# Patient Record
Sex: Male | Born: 1970 | Hispanic: Yes | Marital: Married | Smoking: Never smoker
Health system: Southern US, Community
[De-identification: ages and names within clinical notes are randomized; demographics above are authoritative.]

---

## 2017-08-04 ENCOUNTER — Emergency Department (HOSPITAL_BASED_OUTPATIENT_CLINIC_OR_DEPARTMENT_OTHER)
Admission: EM | Admit: 2017-08-04 | Discharge: 2017-08-05 | Disposition: A | Discharge status: Home / Self Care | Payer: Managed Care, Other (non HMO) | Attending: Emergency Medicine | Admitting: Emergency Medicine

## 2017-08-04 ENCOUNTER — Encounter (HOSPITAL_BASED_OUTPATIENT_CLINIC_OR_DEPARTMENT_OTHER): Payer: Self-pay | Admitting: *Deleted

## 2017-08-04 DIAGNOSIS — R42 Dizziness and giddiness: Secondary | ICD-10-CM

## 2017-08-04 MED ORDER — ONDANSETRON HCL 4 MG/2ML IJ SOLN
INTRAMUSCULAR | Status: AC
  Administered 2017-08-04: 4 mg
  Filled 2017-08-04: qty 2

## 2017-08-04 MED ORDER — PROMETHAZINE HCL 25 MG/ML IJ SOLN
25.0000 mg | Freq: Once | INTRAMUSCULAR | Status: AC
  Administered 2017-08-04: 25 mg via INTRAMUSCULAR
  Filled 2017-08-04 (×2): qty 1

## 2017-08-04 MED ORDER — MECLIZINE HCL 25 MG PO TABS
50.0000 mg | ORAL_TABLET | Freq: Once | ORAL | Status: AC
  Administered 2017-08-04: 50 mg via ORAL
  Filled 2017-08-04: qty 2

## 2017-08-04 NOTE — Discharge Instructions (Addendum)
You are ordered an MRI Brain to receive through the [facility]  Please take this paperwork to ED triage.

## 2017-08-04 NOTE — ED Provider Notes (Signed)
[facility] EMERGENCY DEPARTMENT Provider Note   CSN: [id number] Arrival date & time: 08/04/17  1191     History   Chief Complaint Chief Complaint  Patient presents with  . Dizziness    HPI Calvin Trevino is a 46 y.o. male.  HPI  46 year old male who presents on onset of dizziness this evening while he was walking out of his hotel room. Symptoms occurred at around 6 PM. He states that it feels that the room in front of him is moving. Associated with nausea and vomiting. Has not had similar symptoms before. Symptoms better when he is lying down and closing his eyes. Worse when he is getting up and trying to walk. No recent URI symptoms or fever. No double vision, slurred speech or word finding issues, focal numbness or weakness, chest pain or difficulty breathing.  History reviewed. No pertinent past medical history.  There are no active problems to display for this patient.   History reviewed. No pertinent surgical history.    Home Medications    Prior to Admission medications   Not on File    Family History No family history on file.  Social History Social History  Substance Use Topics  . Smoking status: Never Smoker  . Smokeless tobacco: Never Used  . Alcohol use No     Allergies   Patient has no known allergies.   Review of Systems Review of Systems  Constitutional: Negative for fever.  Respiratory: Negative for shortness of breath.   Cardiovascular: Negative for chest pain.  Gastrointestinal: Positive for nausea and vomiting. Negative for abdominal pain.  Neurological: Negative for headaches.  All other systems reviewed and are negative.    Physical Exam Updated Vital Signs BP 131/71   Pulse 83   Temp 98.1 F (36.7 C) (Oral)   Resp 16   SpO2 97%   Physical Exam Physical Exam  Nursing note and vitals reviewed. Constitutional: Well developed, well nourished, non-toxic, and in no acute distress Head: Normocephalic and atraumatic.    Mouth/Throat: Oropharynx is clear and moist.  Neck: Normal range of motion. Neck supple.  Cardiovascular: Normal rate and regular rhythm.   Pulmonary/Chest: Effort normal and breath sounds normal.  Abdominal: Soft. There is no tenderness. There is no rebound and no guarding.  Musculoskeletal: Normal range of motion.  Skin: Skin is warm and dry.  Psychiatric: Cooperative Neurological:  Alert, oriented to person, place, time, and situation. Memory grossly in tact. Fluent speech. No dysarthria or aphasia.  Cranial nerves: VF are full. Fundoscopic exam-unable to get good visualization of the discs. Pupils are symmetric, and reactive to light. EOMI, but with resting right beating nystagmus. No gaze deviation. Facial muscles symmetric with activation. Sensation to light touch over face in tact bilaterally. Hearing grossly in tact. Palate elevates symmetrically. Head turn and shoulder shrug are intact. Tongue midline.  Reflexes defered.  Muscle bulk and tone normal. No pronator drift. Moves all extremities symmetrically. 4 strength bilateral upper and lower extremities Sensation to light touch is in tact throughout in bilateral upper and lower extremities. Coordination reveals no dysmetria with finger to nose. Normal head impulse test.   ED Treatments / Results  Labs (all labs ordered are listed, but only abnormal results are displayed) Labs Reviewed  CBC WITH DIFFERENTIAL/PLATELET  BASIC METABOLIC PANEL    EKG  EKG Interpretation  Date/Time:  Friday August 04 2017 21:08:26 EDT Ventricular Rate:  88 PR Interval:    QRS Duration: 91 QT Interval:  361  QTC Calculation: 437 R Axis:   83 Text Interpretation:  Sinus rhythm no prior EKG Confirmed by Crista CurbLiu, Phuong Hillary [id number]) on 08/04/2017 11:02:42 PM       Radiology No results found.  Procedures Procedures (including critical care time)  Medications Ordered in ED Medications  promethazine (PHENERGAN) injection 25 mg (25 mg Intramuscular  Given 08/04/17 2217)  ondansetron (ZOFRAN) 4 MG/2ML injection (4 mg  Given 08/04/17 2106)  meclizine (ANTIVERT) tablet 50 mg (50 mg Oral Given 08/04/17 2218)     Initial Impression / Assessment and Plan / ED Course  I have reviewed the triage vital signs and the nursing notes.  Pertinent labs & imaging results that were available during my care of the patient were reviewed by me and considered in my medical decision making (see chart for details).     Presenting with sudden onset of vertiginous symptoms with nausea vomiting and difficulty walking. Still having persistent symptoms when I see him in the ED. He has a resting right beating nystagmus, no vertical, bidirectional or rotary component. Normal head impulse test, no abnormal test of skew. No dysmetria with finger to nose. Initially suspecting potential peripheral etiology, and trialed Phenergan, meclizine. No improvement. Persistent resting unilateral nystagmus, persistent dizziness. [facility] brain. Discussed with Dr. Penne LashIssacs. Accepted for transfer. Patient and wife requested transport by POV.   Final Clinical Impressions(s) / ED Diagnoses   Final diagnoses:  Vertigo  Dizziness    New Prescriptions New Prescriptions   No medications on file     Calvin Trevino, Calvin Marti Duo, MD 08/04/17 2309

## 2017-08-04 NOTE — ED Notes (Signed)
Pt is vomiting at registration.

## 2017-08-04 NOTE — ED Triage Notes (Signed)
Dizziness and vomiting. Room is spinning. Symptoms 15 minutes.

## 2017-08-05 ENCOUNTER — Emergency Department (HOSPITAL_COMMUNITY): Payer: Managed Care, Other (non HMO)

## 2017-08-05 LAB — CBC WITH DIFFERENTIAL/PLATELET
Basophils Absolute: 0.1 10*3/uL (ref 0.0–0.1)
Basophils Relative: 0 %
EOS PCT: 0 %
Eosinophils Absolute: 0.1 10*3/uL (ref 0.0–0.7)
HEMATOCRIT: 42 % (ref 39.0–52.0)
HEMOGLOBIN: 14.9 g/dL (ref 13.0–17.0)
LYMPHS ABS: 2.7 10*3/uL (ref 0.7–4.0)
LYMPHS PCT: 16 %
MCH: 29.6 pg (ref 26.0–34.0)
MCHC: 35.5 g/dL (ref 30.0–36.0)
MCV: 83.5 fL (ref 78.0–100.0)
Monocytes Absolute: 0.7 10*3/uL (ref 0.1–1.0)
Monocytes Relative: 4 %
NEUTROS ABS: 12.7 10*3/uL — AB (ref 1.7–7.7)
Neutrophils Relative %: 79 %
Platelets: 138 10*3/uL — ABNORMAL LOW (ref 150–400)
RBC: 5.03 MIL/uL (ref 4.22–5.81)
RDW: 13.6 % (ref 11.5–15.5)
WBC: 16.1 10*3/uL — AB (ref 4.0–10.5)

## 2017-08-05 LAB — BASIC METABOLIC PANEL
ANION GAP: 9 (ref 5–15)
BUN: 10 mg/dL (ref 6–20)
CHLORIDE: 99 mmol/L — AB (ref 101–111)
CO2: 25 mmol/L (ref 22–32)
Calcium: 8.7 mg/dL — ABNORMAL LOW (ref 8.9–10.3)
Creatinine, Ser: 0.87 mg/dL (ref 0.61–1.24)
GFR calc Af Amer: >60 mL/min (ref >60)
GFR calc non Af Amer: >60 mL/min (ref >60)
GLUCOSE: 213 mg/dL — AB (ref 65–99)
POTASSIUM: 4.7 mmol/L (ref 3.5–5.1)
Sodium: 133 mmol/L — ABNORMAL LOW (ref 135–145)

## 2017-08-05 MED ORDER — LORAZEPAM 2 MG/ML IJ SOLN
1.0000 mg | Freq: Once | INTRAMUSCULAR | Status: AC
  Administered 2017-08-05: 1 mg via INTRAVENOUS
  Filled 2017-08-05: qty 1

## 2017-08-05 MED ORDER — SODIUM CHLORIDE 0.9 % IV BOLUS (SEPSIS)
1000.0000 mL | Freq: Once | INTRAVENOUS | Status: AC
  Administered 2017-08-05: 1000 mL via INTRAVENOUS

## 2017-08-05 MED ORDER — MECLIZINE HCL 12.5 MG PO TABS: 12.5000 mg | ORAL_TABLET | Freq: Two times a day (BID) | ORAL | 0 refills | Status: AC | PRN

## 2017-08-05 NOTE — ED Provider Notes (Signed)
Pt improved He can ambulate D/c home Advised f/[place] in [place] due to mild hyperglycemia    Zadie RhineWickline, Diarra Kos, MD 08/05/17 [phone]

## 2017-08-05 NOTE — ED Notes (Signed)
Patient taken to MRI

## 2017-08-05 NOTE — ED Provider Notes (Signed)
Pt in the ED for vertigo Sent from [facility] for MRI MRI negative Pt still with vertigo symptoms, nystagmus noted Will treat with fluids/ativan and reassess   Calvin Trevino, Calvin Saxer, MD 08/05/17 [phone]

## 2017-08-05 NOTE — ED Notes (Signed)
Pt ambulated to and from hallway bathroom with only standby assist.

## 2019-05-01 IMAGING — MR MR HEAD W/O CM
9 of 10 series · 35 of 48 positions shown · non-contrast
Comparison: None.

CLINICAL DATA: 46 y/o M; dizziness with nausea. Recent head trauma.

EXAM:
MRI HEAD WITHOUT CONTRAST
TECHNIQUE: Multiplanar, multiecho pulse sequences of the brain and surrounding
structures were obtained without intravenous contrast.

[Series 3: DWI · axial · 3.0mm · 0.94mm/px · z∈[-91,+68]mm · 9 of 108 slices shown (1 of 2)]
[im 1/108]
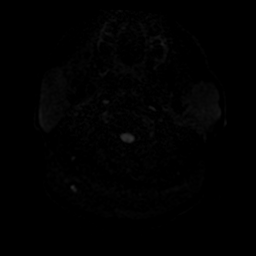
[im 14/108]
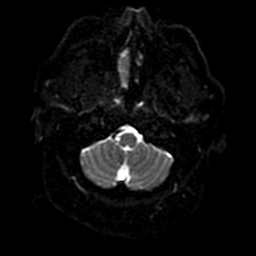
[im 27/108]
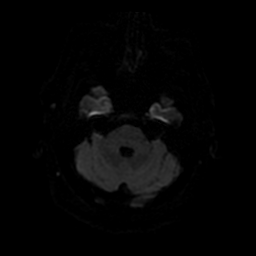
[im 41/108]
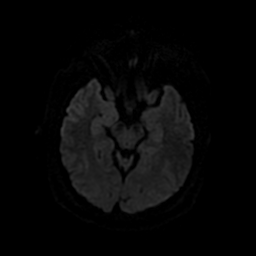
[im 54/108]
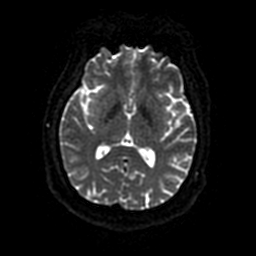
[im 67/108]
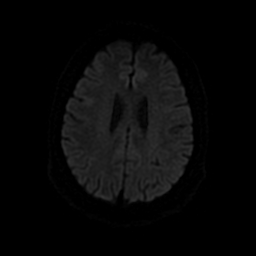
[im 81/108]
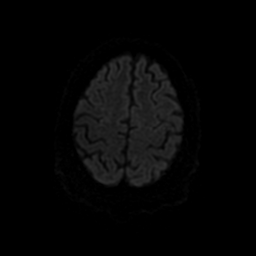
[im 94/108]
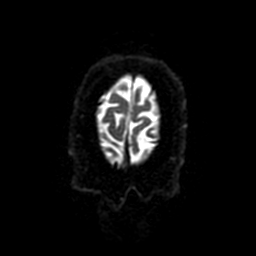
[im 108/108]
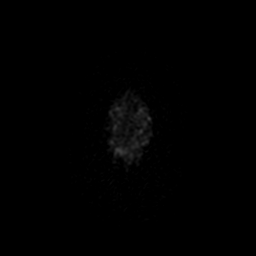

[Series 4: DWI · coronal · 4.0mm · 0.94mm/px · 6 of 72 slices shown (2 of 2)]
[im 1/72]
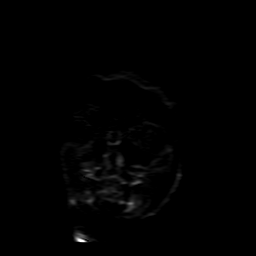
[im 15/72]
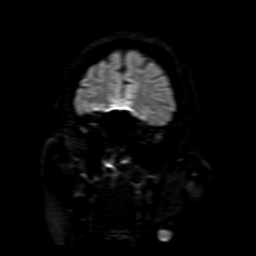
[im 29/72]
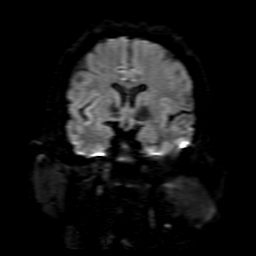
[im 43/72]
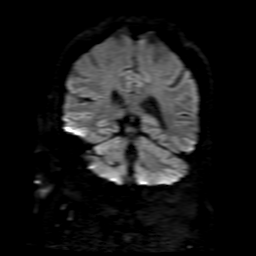
[im 57/72]
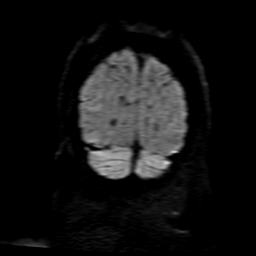
[im 72/72]
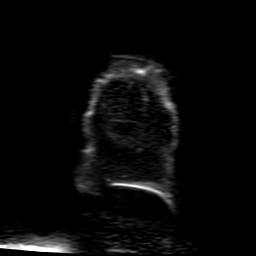

[Series 5: FLAIR · sagittal · 5.0mm · 0.47mm/px · 2 of 24 slices shown (1 of 2)]
[im 1/24]
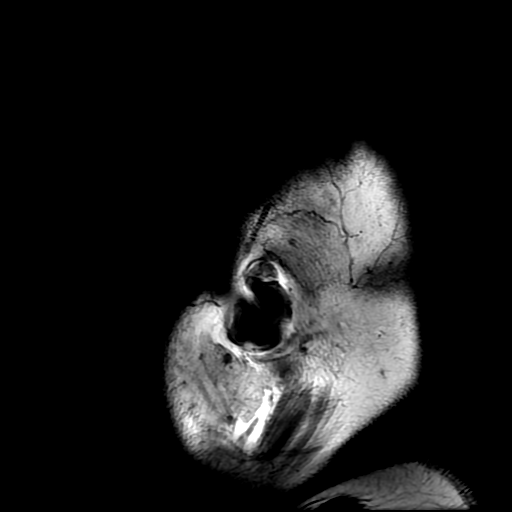
[im 24/24]
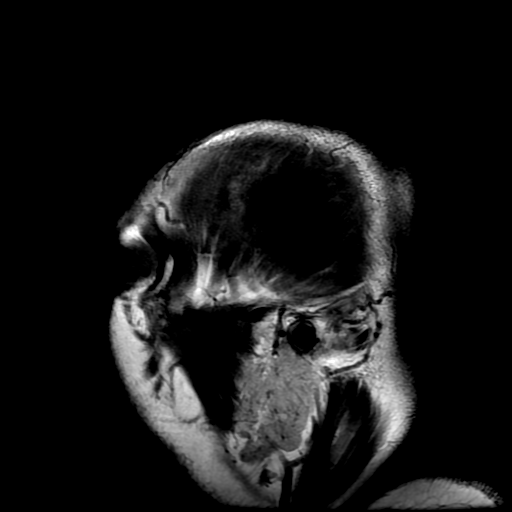

[Series 6: T2 · axial · 5.0mm · 0.47mm/px · z∈[-92,+76]mm · 3 of 29 slices shown (1 of 2)]
[im 1/29]
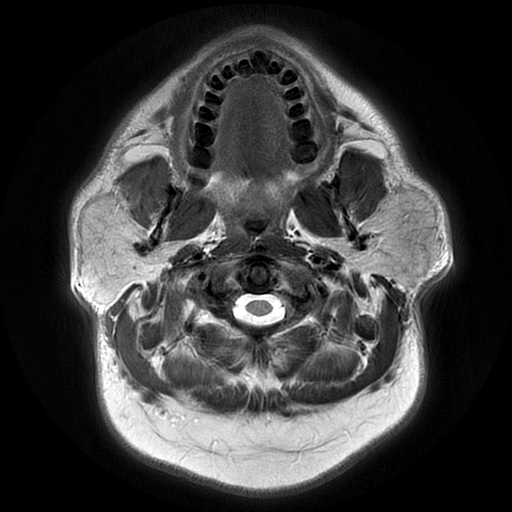
[im 15/29]
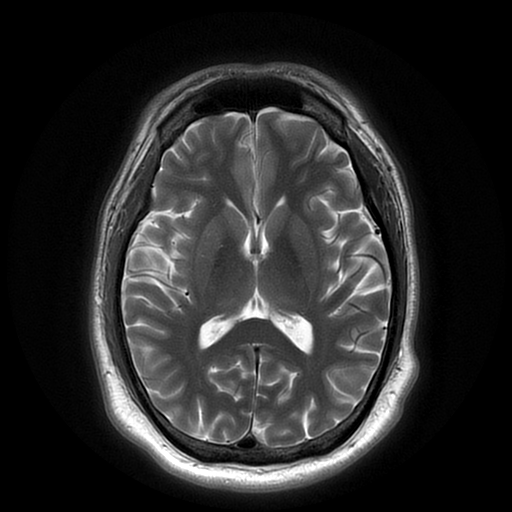
[im 29/29]
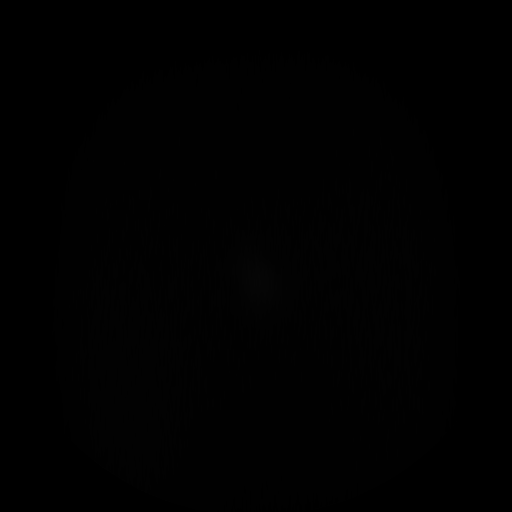

[Series 7: FLAIR · axial · 3.0mm · 0.47mm/px · z∈[-92,+70]mm · 2 of 28 slices shown (2 of 2)]
[im 1/28]
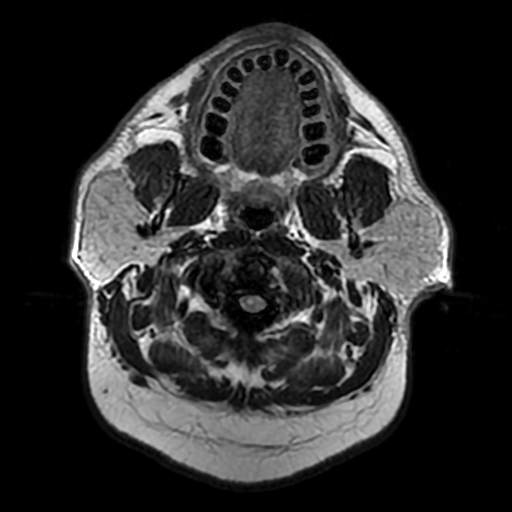
[im 28/28]
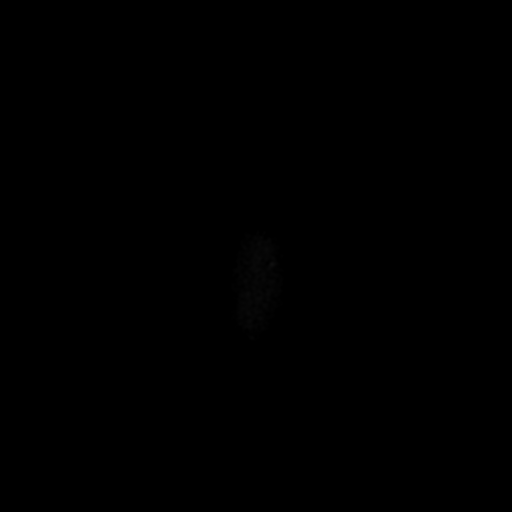

[Series 8: (person_name) · axial · 3.0mm · 0.47mm/px · z∈[-93,-76]mm · 2 of 108 slices shown]
[im 1/108]
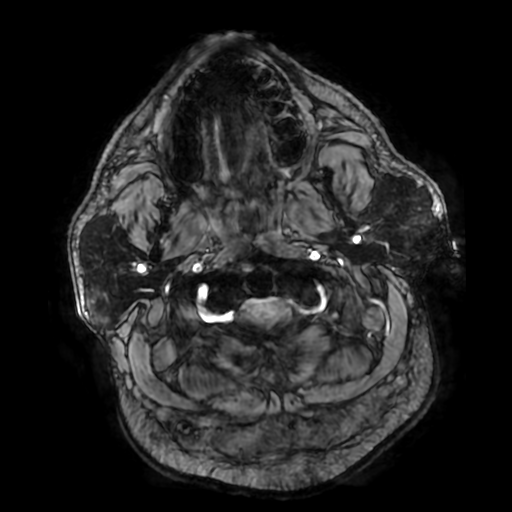
[im 12/108]
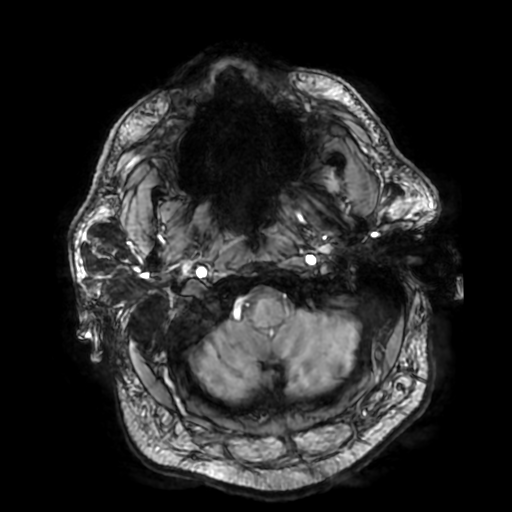

[Series 10: T2 · coronal · 5.0mm · 0.39mm/px · 3 of 30 slices shown (2 of 2)]
[im 1/30]
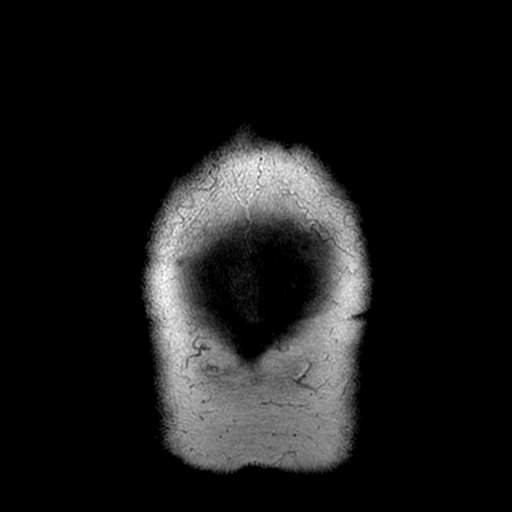
[im 15/30]
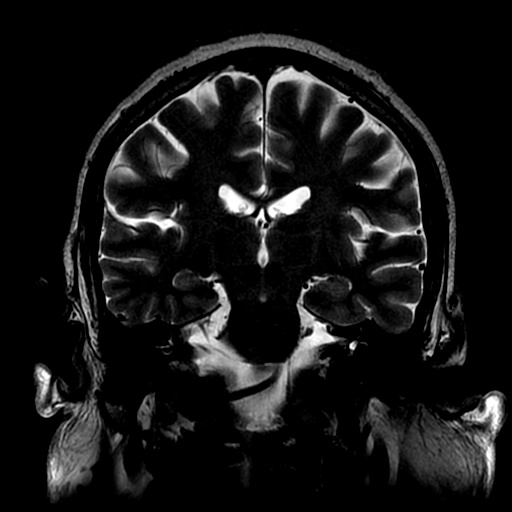
[im 30/30]
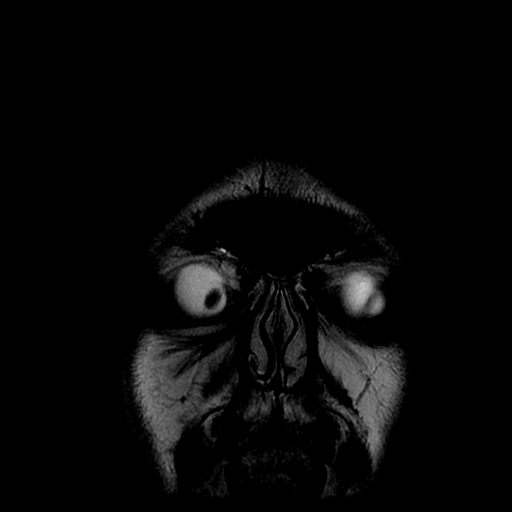

[Series 350: ADC · axial · 3.0mm · 0.94mm/px · z∈[-91,+68]mm · 5 of 54 slices shown (1 of 2)]
[im 1/54]
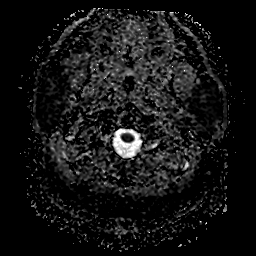
[im 14/54]
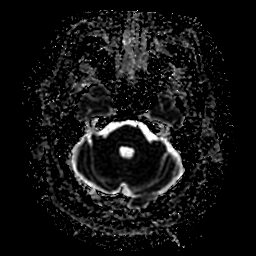
[im 27/54]
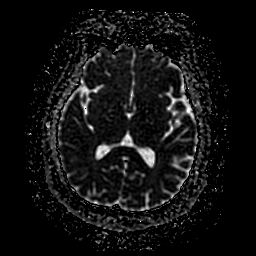
[im 40/54]
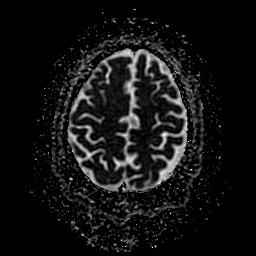
[im 54/54]
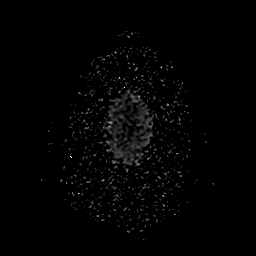

[Series 450: ADC · coronal · 4.0mm · 0.94mm/px · 3 of 36 slices shown (2 of 2)]
[im 1/36]
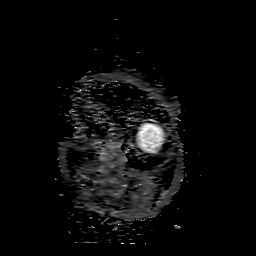
[im 18/36]
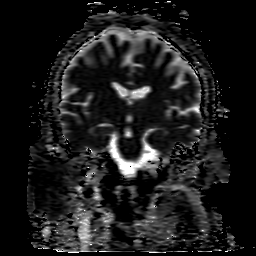
[im 36/36]
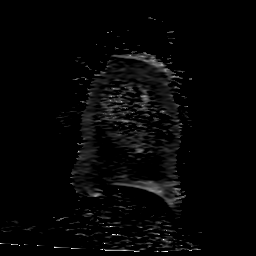

[35 of 48 positions shown; findings below may reference images not displayed]

FINDINGS: Brain: No acute infarction, hemorrhage, hydrocephalus, extra-axial
collection or mass lesion.

Vascular: Normal flow voids.

Skull and upper cervical spine: Normal marrow signal.

Sinuses/Orbits: Negative.

Other: None.
IMPRESSION: No acute intracranial abnormality. No findings of diffuse axonal
injury or cortical contusion. Unremarkable MRI of the brain.

By: Bebecito Olortegui M.D.
